# Patient Record
Sex: Female | Born: 2000 | Race: White | Hispanic: No | Marital: Single | Smoking: Never smoker
Health system: Southern US, Community
[De-identification: ages and names within clinical notes are randomized; demographics above are authoritative.]

## PROBLEM LIST (undated history)

## (undated) ENCOUNTER — Ambulatory Visit: Source: Home / Self Care

## (undated) DIAGNOSIS — R519 Headache, unspecified: Secondary | ICD-10-CM

## (undated) DIAGNOSIS — Z803 Family history of malignant neoplasm of breast: Secondary | ICD-10-CM

## (undated) DIAGNOSIS — Z801 Family history of malignant neoplasm of trachea, bronchus and lung: Secondary | ICD-10-CM

## (undated) DIAGNOSIS — R51 Headache: Secondary | ICD-10-CM

## (undated) DIAGNOSIS — J45909 Unspecified asthma, uncomplicated: Secondary | ICD-10-CM

## (undated) DIAGNOSIS — F419 Anxiety disorder, unspecified: Secondary | ICD-10-CM

## (undated) DIAGNOSIS — Z8341 Family history of multiple endocrine neoplasia [MEN] syndrome: Secondary | ICD-10-CM

## (undated) HISTORY — DX: Family history of multiple endocrine neoplasia (MEN) syndrome: Z83.41

## (undated) HISTORY — PX: TONSILLECTOMY: SUR1361

## (undated) HISTORY — DX: Family history of malignant neoplasm of breast: Z80.3

## (undated) HISTORY — DX: Family history of malignant neoplasm of trachea, bronchus and lung: Z80.1

---
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
## 2020-01-22 ENCOUNTER — Other Ambulatory Visit: Payer: Self-pay

## 2020-01-22 ENCOUNTER — Ambulatory Visit: Payer: Managed Care, Other (non HMO) | Admitting: Nurse Practitioner

## 2020-01-22 ENCOUNTER — Encounter: Payer: Self-pay | Admitting: Nurse Practitioner

## 2020-01-22 VITALS — BP 118/80

## 2020-01-22 DIAGNOSIS — N920 Excessive and frequent menstruation with regular cycle: Secondary | ICD-10-CM

## 2020-01-22 DIAGNOSIS — L700 Acne vulgaris: Secondary | ICD-10-CM

## 2020-01-22 NOTE — Progress Notes (Signed)
   Acute Office Visit  Subjective:    Patient ID: Crystal Holland, female    DOB: 09-Dec-2000, 19 y.o.   MRN: [id number]   HPI 19 y.o. presents today for heavy cycle last month with severe dysmenorrhea.  Bled through super tampon within 3 hours and heavy bleeding lasted 3 to 4 days.  Cycles are regular and normally with some moderate bleeding for 1 to 2 days with mild cramping.  Not sexually active.  Also complains of worsening acne and is seeing dermatology for this.  She was on OCPs in the past and stopped these about 6 months ago and does not want to restart.  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  She would like hormones checked today but wants to wait on TSH.   Review of Systems  Constitutional: Negative.   Gastrointestinal: Negative.   Genitourinary: Positive for menstrual problem and vaginal bleeding.  Skin:       Acne vulgaris on face       Objective:    Physical Exam Constitutional:      Appearance: Normal appearance.  Genitourinary:    General: Normal vulva.     Comments: Declined speculum exam. Normal digital exam Skin:    Findings: Acne (face) present.     BP 118/80 (BP Location: Right Arm, Patient Position: Sitting, Cuff Size: Normal)   LMP 01/14/2020  Wt Readings from Last 3 Encounters:  12/27/19 110 lb (49.9 kg) (17 %, Z= -0.96)*  02/13/19 120 lb (54.4 kg) (41 %, Z= -0.22)*  03/18/18 119 lb 4.3 oz (54.1 kg) (44 %, Z= -0.15)*   * Growth percentiles are based on CDC (Girls, 2-20 Years) data.        Assessment & Plan:   Problem List Items Addressed This Visit    None    Visit Diagnoses    Menorrhagia with regular cycle    -  Primary   Relevant Orders   Prolactin   Estradiol   Progesterone   Acne vulgaris         Plan: We will check hormones today per her request.  Informed that if acne is hormone-related hormonal contraception can help with this.  She is currently being treated by  dermatology with doxycycline and topical agents.  Since she has only had 1 abnormal cycle we agreed to watch for a couple of months and if they continue to be heavy we will schedule an ultrasound.  Encouraged to follow-up with endocrinology.  She is agreeable to plan.     Olivia Mackie [facility], 9:35 AM 01/22/2020

## 2020-01-23 LAB — ESTRADIOL: Estradiol: 45 pg/mL

## 2020-01-23 LAB — PROLACTIN: Prolactin: 8.1 ng/mL

## 2020-01-23 LAB — PROGESTERONE: Progesterone: 1 ng/mL

## 2020-02-16 ENCOUNTER — Other Ambulatory Visit: Payer: Self-pay | Admitting: Internal Medicine

## 2020-02-16 DIAGNOSIS — R1031 Right lower quadrant pain: Secondary | ICD-10-CM

## 2020-02-16 DIAGNOSIS — E3120 Multiple endocrine neoplasia [MEN] syndrome, unspecified: Secondary | ICD-10-CM

## 2020-02-16 DIAGNOSIS — R102 Pelvic and perineal pain: Secondary | ICD-10-CM

## 2020-02-27 ENCOUNTER — Encounter: Payer: Managed Care, Other (non HMO) | Admitting: Nurse Practitioner

## 2020-02-27 DIAGNOSIS — Z0289 Encounter for other administrative examinations: Secondary | ICD-10-CM

## 2020-03-19 ENCOUNTER — Other Ambulatory Visit: Payer: Managed Care, Other (non HMO)

## 2020-03-19 ENCOUNTER — Inpatient Hospital Stay: Admission: RE | Admit: 2020-03-19 | Payer: Managed Care, Other (non HMO) | Source: Ambulatory Visit

## 2020-11-27 ENCOUNTER — Other Ambulatory Visit: Payer: Self-pay | Admitting: Internal Medicine

## 2020-11-27 DIAGNOSIS — E3121 Multiple endocrine neoplasia [MEN] type I: Secondary | ICD-10-CM

## 2020-11-27 DIAGNOSIS — Z8341 Family history of multiple endocrine neoplasia [MEN] syndrome: Secondary | ICD-10-CM

## 2020-12-18 ENCOUNTER — Other Ambulatory Visit: Payer: Managed Care, Other (non HMO)

## 2020-12-27 ENCOUNTER — Other Ambulatory Visit: Payer: Self-pay | Admitting: Internal Medicine

## 2020-12-27 DIAGNOSIS — Z8341 Family history of multiple endocrine neoplasia [MEN] syndrome: Secondary | ICD-10-CM

## 2020-12-27 DIAGNOSIS — E3121 Multiple endocrine neoplasia [MEN] type I: Secondary | ICD-10-CM

## 2021-10-15 ENCOUNTER — Other Ambulatory Visit: Payer: Self-pay | Admitting: Internal Medicine

## 2021-10-15 DIAGNOSIS — E3121 Multiple endocrine neoplasia [MEN] type I: Secondary | ICD-10-CM

## 2021-11-03 ENCOUNTER — Other Ambulatory Visit: Payer: Self-pay | Admitting: Internal Medicine

## 2021-11-03 DIAGNOSIS — E3120 Multiple endocrine neoplasia [MEN] syndrome, unspecified: Secondary | ICD-10-CM

## 2021-11-03 DIAGNOSIS — Z8341 Family history of multiple endocrine neoplasia [MEN] syndrome: Secondary | ICD-10-CM

## 2021-12-09 ENCOUNTER — Ambulatory Visit
Admission: RE | Admit: 2021-12-09 | Discharge: 2021-12-09 | Disposition: A | Discharge status: Home / Self Care | Payer: Managed Care, Other (non HMO) | Source: Ambulatory Visit | Attending: Internal Medicine | Admitting: Internal Medicine

## 2021-12-09 DIAGNOSIS — Z8341 Family history of multiple endocrine neoplasia [MEN] syndrome: Secondary | ICD-10-CM

## 2021-12-09 DIAGNOSIS — E3120 Multiple endocrine neoplasia [MEN] syndrome, unspecified: Secondary | ICD-10-CM

## 2021-12-09 MED ORDER — GADOPICLENOL 0.5 MMOL/ML IV SOLN
6.0000 mL | Freq: Once | INTRAVENOUS | Status: AC | PRN
  Administered 2021-12-09: 6 mL via INTRAVENOUS

## 2023-06-21 ENCOUNTER — Other Ambulatory Visit: Payer: Self-pay | Admitting: Nurse Practitioner

## 2023-06-21 DIAGNOSIS — E3121 Multiple endocrine neoplasia [MEN] type I: Secondary | ICD-10-CM

## 2023-07-27 ENCOUNTER — Ambulatory Visit
Admission: RE | Admit: 2023-07-27 | Discharge: 2023-07-27 | Disposition: A | Discharge status: Home / Self Care | Source: Ambulatory Visit | Attending: Nurse Practitioner | Admitting: Nurse Practitioner

## 2023-07-27 DIAGNOSIS — E3121 Multiple endocrine neoplasia [MEN] type I: Secondary | ICD-10-CM

## 2023-07-27 MED ORDER — GADOPICLENOL 0.5 MMOL/ML IV SOLN
5.0000 mL | Freq: Once | INTRAVENOUS | Status: AC | PRN
  Administered 2023-07-27: 5 mL via INTRAVENOUS

## 2023-08-10 ENCOUNTER — Other Ambulatory Visit (HOSPITAL_COMMUNITY): Payer: Self-pay | Admitting: Nurse Practitioner

## 2023-08-10 DIAGNOSIS — K869 Disease of pancreas, unspecified: Secondary | ICD-10-CM

## 2023-08-10 DIAGNOSIS — E3121 Multiple endocrine neoplasia [MEN] type I: Secondary | ICD-10-CM

## 2023-08-26 ENCOUNTER — Encounter (HOSPITAL_COMMUNITY)

## 2023-08-26 ENCOUNTER — Encounter (HOSPITAL_COMMUNITY)
Admission: RE | Admit: 2023-08-26 | Discharge: 2023-08-26 | Disposition: A | Discharge status: Home / Self Care | Source: Ambulatory Visit | Attending: Nurse Practitioner | Admitting: Nurse Practitioner

## 2023-08-26 ENCOUNTER — Ambulatory Visit (HOSPITAL_COMMUNITY)

## 2023-08-26 DIAGNOSIS — E3121 Multiple endocrine neoplasia [MEN] type I: Secondary | ICD-10-CM | POA: Diagnosis present

## 2023-08-26 DIAGNOSIS — K869 Disease of pancreas, unspecified: Secondary | ICD-10-CM | POA: Diagnosis present

## 2023-08-26 MED ORDER — COPPER CU 64 DOTATATE 1 MCI/ML IV SOLN
4.0000 | Freq: Once | INTRAVENOUS | Status: AC
  Administered 2023-08-26: 3.95 via INTRAVENOUS

## 2023-09-03 ENCOUNTER — Other Ambulatory Visit (HOSPITAL_COMMUNITY)
